# Patient Record
Sex: Female | Born: 1950 | Race: Black or African American | Hispanic: No | Marital: Married | Smoking: Former smoker
Health system: Southern US, Community
[De-identification: ages and names within clinical notes are randomized; demographics above are authoritative.]

## PROBLEM LIST (undated history)

## (undated) HISTORY — PX: ABDOMINAL HYSTERECTOMY: SHX81

---

## 1999-09-24 ENCOUNTER — Encounter: Payer: Self-pay | Admitting: Family Medicine

## 1999-09-24 ENCOUNTER — Ambulatory Visit (HOSPITAL_COMMUNITY): Admission: RE | Admit: 1999-09-24 | Discharge: 1999-09-24 | Payer: Self-pay | Admitting: Family Medicine

## 2000-05-12 ENCOUNTER — Encounter: Payer: Self-pay | Admitting: Family Medicine

## 2000-05-12 ENCOUNTER — Encounter: Admission: RE | Admit: 2000-05-12 | Discharge: 2000-05-12 | Payer: Self-pay | Admitting: Family Medicine

## 2000-06-20 ENCOUNTER — Encounter: Admission: RE | Admit: 2000-06-20 | Discharge: 2000-06-20 | Payer: Self-pay | Admitting: Obstetrics and Gynecology

## 2000-06-20 ENCOUNTER — Encounter: Payer: Self-pay | Admitting: Obstetrics and Gynecology

## 2000-07-14 ENCOUNTER — Encounter: Payer: Self-pay | Admitting: Obstetrics and Gynecology

## 2000-07-14 ENCOUNTER — Encounter: Admission: RE | Admit: 2000-07-14 | Discharge: 2000-07-14 | Payer: Self-pay | Admitting: Obstetrics and Gynecology

## 2001-09-14 ENCOUNTER — Other Ambulatory Visit: Admission: RE | Admit: 2001-09-14 | Discharge: 2001-09-14 | Payer: Self-pay | Admitting: Obstetrics and Gynecology

## 2001-12-25 ENCOUNTER — Ambulatory Visit (HOSPITAL_COMMUNITY): Admission: RE | Admit: 2001-12-25 | Discharge: 2001-12-25 | Payer: Self-pay | Admitting: Family Medicine

## 2001-12-25 ENCOUNTER — Encounter: Payer: Self-pay | Admitting: Family Medicine

## 2005-07-30 ENCOUNTER — Encounter: Admission: RE | Admit: 2005-07-30 | Discharge: 2005-07-30 | Payer: Self-pay | Admitting: Family Medicine

## 2005-08-09 ENCOUNTER — Ambulatory Visit (HOSPITAL_COMMUNITY): Admission: RE | Admit: 2005-08-09 | Discharge: 2005-08-09 | Payer: Self-pay | Admitting: Family Medicine

## 2006-09-22 ENCOUNTER — Encounter: Admission: RE | Admit: 2006-09-22 | Discharge: 2006-09-22 | Payer: Self-pay | Admitting: Family Medicine

## 2008-04-10 ENCOUNTER — Encounter: Admission: RE | Admit: 2008-04-10 | Discharge: 2008-04-10 | Payer: Self-pay | Admitting: Family Medicine

## 2009-06-11 ENCOUNTER — Encounter: Admission: RE | Admit: 2009-06-11 | Discharge: 2009-06-11 | Payer: Self-pay | Admitting: Obstetrics and Gynecology

## 2014-02-26 ENCOUNTER — Other Ambulatory Visit: Payer: Self-pay

## 2014-02-26 DIAGNOSIS — Z1231 Encounter for screening mammogram for malignant neoplasm of breast: Secondary | ICD-10-CM

## 2014-03-12 ENCOUNTER — Ambulatory Visit
Admission: RE | Admit: 2014-03-12 | Discharge: 2014-03-12 | Disposition: A | Discharge status: Home / Self Care | Payer: BC Managed Care – PPO | Source: Ambulatory Visit

## 2014-03-12 DIAGNOSIS — Z1231 Encounter for screening mammogram for malignant neoplasm of breast: Secondary | ICD-10-CM

## 2014-09-18 DIAGNOSIS — I10 Essential (primary) hypertension: Secondary | ICD-10-CM | POA: Diagnosis not present

## 2015-01-08 DIAGNOSIS — H2513 Age-related nuclear cataract, bilateral: Secondary | ICD-10-CM | POA: Diagnosis not present

## 2015-01-08 DIAGNOSIS — H43812 Vitreous degeneration, left eye: Secondary | ICD-10-CM | POA: Diagnosis not present

## 2015-01-08 DIAGNOSIS — H43392 Other vitreous opacities, left eye: Secondary | ICD-10-CM | POA: Diagnosis not present

## 2015-01-08 DIAGNOSIS — H16103 Unspecified superficial keratitis, bilateral: Secondary | ICD-10-CM | POA: Diagnosis not present

## 2015-01-27 DIAGNOSIS — H16102 Unspecified superficial keratitis, left eye: Secondary | ICD-10-CM | POA: Diagnosis not present

## 2015-01-27 DIAGNOSIS — H43812 Vitreous degeneration, left eye: Secondary | ICD-10-CM | POA: Diagnosis not present

## 2015-01-27 DIAGNOSIS — H16101 Unspecified superficial keratitis, right eye: Secondary | ICD-10-CM | POA: Diagnosis not present

## 2015-01-27 DIAGNOSIS — H43392 Other vitreous opacities, left eye: Secondary | ICD-10-CM | POA: Diagnosis not present

## 2015-01-28 ENCOUNTER — Other Ambulatory Visit: Payer: Self-pay

## 2015-01-28 DIAGNOSIS — Z1231 Encounter for screening mammogram for malignant neoplasm of breast: Secondary | ICD-10-CM

## 2015-03-18 ENCOUNTER — Ambulatory Visit
Admission: RE | Admit: 2015-03-18 | Discharge: 2015-03-18 | Disposition: A | Discharge status: Home / Self Care | Payer: Commercial Managed Care - HMO | Source: Ambulatory Visit

## 2015-03-18 DIAGNOSIS — Z1231 Encounter for screening mammogram for malignant neoplasm of breast: Secondary | ICD-10-CM | POA: Diagnosis not present

## 2015-04-08 DIAGNOSIS — Z1389 Encounter for screening for other disorder: Secondary | ICD-10-CM | POA: Diagnosis not present

## 2015-04-08 DIAGNOSIS — Z0000 Encounter for general adult medical examination without abnormal findings: Secondary | ICD-10-CM | POA: Diagnosis not present

## 2015-04-08 DIAGNOSIS — I10 Essential (primary) hypertension: Secondary | ICD-10-CM | POA: Diagnosis not present

## 2015-10-08 DIAGNOSIS — I10 Essential (primary) hypertension: Secondary | ICD-10-CM | POA: Diagnosis not present

## 2015-10-08 DIAGNOSIS — M722 Plantar fascial fibromatosis: Secondary | ICD-10-CM | POA: Diagnosis not present

## 2016-03-16 ENCOUNTER — Other Ambulatory Visit: Payer: Self-pay

## 2016-03-16 DIAGNOSIS — Z1231 Encounter for screening mammogram for malignant neoplasm of breast: Secondary | ICD-10-CM

## 2016-03-31 ENCOUNTER — Ambulatory Visit
Admission: RE | Admit: 2016-03-31 | Discharge: 2016-03-31 | Disposition: A | Discharge status: Home / Self Care | Payer: Commercial Managed Care - HMO | Source: Ambulatory Visit

## 2016-03-31 DIAGNOSIS — Z1231 Encounter for screening mammogram for malignant neoplasm of breast: Secondary | ICD-10-CM | POA: Diagnosis not present

## 2016-04-19 DIAGNOSIS — Z0000 Encounter for general adult medical examination without abnormal findings: Secondary | ICD-10-CM | POA: Diagnosis not present

## 2016-04-19 DIAGNOSIS — M722 Plantar fascial fibromatosis: Secondary | ICD-10-CM | POA: Diagnosis not present

## 2016-04-19 DIAGNOSIS — I10 Essential (primary) hypertension: Secondary | ICD-10-CM | POA: Diagnosis not present

## 2016-04-19 DIAGNOSIS — N951 Menopausal and female climacteric states: Secondary | ICD-10-CM | POA: Diagnosis not present

## 2016-04-19 DIAGNOSIS — L259 Unspecified contact dermatitis, unspecified cause: Secondary | ICD-10-CM | POA: Diagnosis not present

## 2016-05-17 DIAGNOSIS — L820 Inflamed seborrheic keratosis: Secondary | ICD-10-CM | POA: Diagnosis not present

## 2016-05-17 DIAGNOSIS — L438 Other lichen planus: Secondary | ICD-10-CM | POA: Diagnosis not present

## 2016-05-17 DIAGNOSIS — L811 Chloasma: Secondary | ICD-10-CM | POA: Diagnosis not present

## 2016-07-19 DIAGNOSIS — I10 Essential (primary) hypertension: Secondary | ICD-10-CM | POA: Diagnosis not present

## 2016-09-10 LAB — POCT GLYCOSYLATED HEMOGLOBIN (HGB A1C)

## 2016-09-10 LAB — GLUCOSE, POCT (MANUAL RESULT ENTRY): POC GLUCOSE: 101 mg/dL — AB (ref 70–99)

## 2016-09-21 DIAGNOSIS — Z23 Encounter for immunization: Secondary | ICD-10-CM | POA: Diagnosis not present

## 2016-09-21 DIAGNOSIS — I10 Essential (primary) hypertension: Secondary | ICD-10-CM | POA: Diagnosis not present

## 2017-01-31 DIAGNOSIS — H5201 Hypermetropia, right eye: Secondary | ICD-10-CM | POA: Diagnosis not present

## 2017-04-08 ENCOUNTER — Other Ambulatory Visit: Payer: Self-pay | Admitting: Family Medicine

## 2017-04-08 DIAGNOSIS — Z1231 Encounter for screening mammogram for malignant neoplasm of breast: Secondary | ICD-10-CM

## 2017-04-25 DIAGNOSIS — Z6841 Body Mass Index (BMI) 40.0 and over, adult: Secondary | ICD-10-CM | POA: Diagnosis not present

## 2017-04-25 DIAGNOSIS — L729 Follicular cyst of the skin and subcutaneous tissue, unspecified: Secondary | ICD-10-CM | POA: Diagnosis not present

## 2017-04-25 DIAGNOSIS — R030 Elevated blood-pressure reading, without diagnosis of hypertension: Secondary | ICD-10-CM | POA: Diagnosis not present

## 2017-05-02 DIAGNOSIS — K635 Polyp of colon: Secondary | ICD-10-CM | POA: Diagnosis not present

## 2017-05-03 ENCOUNTER — Ambulatory Visit: Payer: Commercial Managed Care - HMO

## 2017-05-11 ENCOUNTER — Ambulatory Visit
Admission: RE | Admit: 2017-05-11 | Discharge: 2017-05-11 | Disposition: A | Discharge status: Home / Self Care | Payer: Commercial Managed Care - HMO | Source: Ambulatory Visit | Attending: Family Medicine | Admitting: Family Medicine

## 2017-05-11 DIAGNOSIS — Z1231 Encounter for screening mammogram for malignant neoplasm of breast: Secondary | ICD-10-CM

## 2017-11-28 DIAGNOSIS — Z0000 Encounter for general adult medical examination without abnormal findings: Secondary | ICD-10-CM | POA: Diagnosis not present

## 2017-11-28 DIAGNOSIS — M109 Gout, unspecified: Secondary | ICD-10-CM | POA: Diagnosis not present

## 2017-11-28 DIAGNOSIS — Z1389 Encounter for screening for other disorder: Secondary | ICD-10-CM | POA: Diagnosis not present

## 2017-11-28 DIAGNOSIS — Z6841 Body Mass Index (BMI) 40.0 and over, adult: Secondary | ICD-10-CM | POA: Diagnosis not present

## 2017-11-28 DIAGNOSIS — I10 Essential (primary) hypertension: Secondary | ICD-10-CM | POA: Diagnosis not present

## 2018-06-14 DIAGNOSIS — M109 Gout, unspecified: Secondary | ICD-10-CM | POA: Diagnosis not present

## 2018-06-14 DIAGNOSIS — Z6841 Body Mass Index (BMI) 40.0 and over, adult: Secondary | ICD-10-CM | POA: Diagnosis not present

## 2018-06-14 DIAGNOSIS — I10 Essential (primary) hypertension: Secondary | ICD-10-CM | POA: Diagnosis not present

## 2018-06-14 DIAGNOSIS — Z1159 Encounter for screening for other viral diseases: Secondary | ICD-10-CM | POA: Diagnosis not present

## 2018-06-15 ENCOUNTER — Other Ambulatory Visit: Payer: Self-pay | Admitting: Family Medicine

## 2018-06-15 ENCOUNTER — Ambulatory Visit: Payer: Self-pay | Admitting: Podiatry

## 2018-06-15 DIAGNOSIS — Z1231 Encounter for screening mammogram for malignant neoplasm of breast: Secondary | ICD-10-CM

## 2018-07-11 ENCOUNTER — Other Ambulatory Visit: Payer: Self-pay | Admitting: Podiatry

## 2018-07-11 ENCOUNTER — Encounter: Payer: Self-pay | Admitting: Podiatry

## 2018-07-11 ENCOUNTER — Ambulatory Visit (INDEPENDENT_AMBULATORY_CARE_PROVIDER_SITE_OTHER): Payer: Medicare HMO

## 2018-07-11 ENCOUNTER — Encounter

## 2018-07-11 ENCOUNTER — Ambulatory Visit: Payer: Medicare HMO | Admitting: Podiatry

## 2018-07-11 VITALS — BP 157/80 | HR 83 | Resp 16

## 2018-07-11 DIAGNOSIS — M7751 Other enthesopathy of right foot: Secondary | ICD-10-CM | POA: Diagnosis not present

## 2018-07-11 DIAGNOSIS — G5761 Lesion of plantar nerve, right lower limb: Secondary | ICD-10-CM

## 2018-07-11 DIAGNOSIS — G5781 Other specified mononeuropathies of right lower limb: Secondary | ICD-10-CM

## 2018-07-11 DIAGNOSIS — M779 Enthesopathy, unspecified: Principal | ICD-10-CM

## 2018-07-11 DIAGNOSIS — M778 Other enthesopathies, not elsewhere classified: Secondary | ICD-10-CM

## 2018-07-11 DIAGNOSIS — M722 Plantar fascial fibromatosis: Secondary | ICD-10-CM

## 2018-07-12 NOTE — Progress Notes (Signed)
  Subjective:  Patient ID: Carrie Woods, female    DOB: November 29, 1951,  MRN: [id number] HPI Chief Complaint  Patient presents with  . Foot Pain    Plantar forefoot and arch right - aching and numbness x 1 month, "pebbles in foot sometimes", history of PF, took anti-inflam and inserts (helped then), PCP rx'd meloxicam - no help and icing at home  . New Patient (Initial Visit)    67 y.o. female presents with the above complaint.   ROS: Denies fever chills nausea vomiting muscle aches pains calf pain back pain chest pain shortness of breath.  No past medical history on file. Past Surgical History:  Procedure Laterality Date  . ABDOMINAL HYSTERECTOMY      Current Outpatient Medications:  .  amLODipine (NORVASC) 5 MG tablet, Take 5 mg by mouth daily., Disp: , Rfl:  .  naproxen (NAPROSYN) 500 MG tablet, Take 500 mg by mouth 2 (two) times daily with a meal., Disp: , Rfl:  .  olmesartan (BENICAR) 40 MG tablet, Take 40 mg by mouth daily., Disp: , Rfl:  .  allopurinol (ZYLOPRIM) 300 MG tablet, 1/2 TABLET TWICE A DAY X 1 WEEK, THEN 1 TABLET ONCE A DAY THEREAFTER ORALLY, Disp: , Rfl: 5 .  meloxicam (MOBIC) 15 MG tablet, 1 TABLET EVERY MORNING WITH FOOD ORALLY, Disp: , Rfl: 5  No Known Allergies Review of Systems Objective:   Vitals:   07/11/18 1030  BP: (!) 157/80  Pulse: 83  Resp: 16    General: Well developed, nourished, in no acute distress, alert and oriented x3   Dermatological: Skin is warm, dry and supple bilateral. Nails x 10 are well maintained; remaining integument appears unremarkable at this time. There are no open sores, no preulcerative lesions, no rash or signs of infection present.  Vascular: Dorsalis Pedis artery and Posterior Tibial artery pedal pulses are 2/4 bilateral with immedate capillary fill time. Pedal hair growth present. No varicosities and no lower extremity edema present bilateral.   Neruologic: Grossly intact via light touch bilateral. Vibratory intact via  tuning fork bilateral. Protective threshold with Semmes Wienstein monofilament intact to all pedal sites bilateral. Patellar and Achilles deep tendon reflexes 2+ bilateral. No Babinski or clonus noted bilateral.   Musculoskeletal: No gross boney pedal deformities bilateral. No pain, crepitus, or limitation noted with foot and ankle range of motion bilateral. Muscular strength 5/5 in all groups tested bilateral.  Pain on palpation and end range of motion of the second metatarsal phalangeal joint.  She also has limited range of motion of the first metatarsophalangeal joints with mild bunion deformities bilateral.  Gait: Unassisted, Nonantalgic.    Radiographs:  Taken today demonstrate no major osseous abnormalities to the forefoot.  No acute findings.  Joint space narrowing of the first metatarsophalangeal joint of the right foot with mild bunion deformity is also noted.  Assessment & Plan:   Assessment: Capsulitis second metatarsal phalangeal joint right foot  Plan: Discussed etiology pathology conservative versus surgical therapies at this point after sterile Betadine skin prep I injected 20 mg Kenalog 5 mg Marcaine and a periarticular fashion around the second metatarsal phalangeal joint right foot.  We discussed appropriate shoe gear stretching exercises ice therapy as your modifications.  I will follow-up with her in a month or so.     Jahyra Sukup Carrie Woods, [place]

## 2018-07-14 ENCOUNTER — Ambulatory Visit
Admission: RE | Admit: 2018-07-14 | Discharge: 2018-07-14 | Disposition: A | Discharge status: Home / Self Care | Payer: Medicare HMO | Source: Ambulatory Visit | Attending: Family Medicine | Admitting: Family Medicine

## 2018-07-14 DIAGNOSIS — Z1231 Encounter for screening mammogram for malignant neoplasm of breast: Secondary | ICD-10-CM | POA: Diagnosis not present

## 2018-08-08 ENCOUNTER — Ambulatory Visit: Payer: Medicare HMO | Admitting: Podiatry

## 2018-09-04 DIAGNOSIS — H5201 Hypermetropia, right eye: Secondary | ICD-10-CM | POA: Diagnosis not present

## 2018-09-21 ENCOUNTER — Telehealth: Payer: Self-pay | Admitting: Podiatry

## 2018-09-21 MED ORDER — METHYLPREDNISOLONE 4 MG PO TBPK: ORAL_TABLET | ORAL | 0 refills | Status: DC

## 2018-09-21 NOTE — Telephone Encounter (Signed)
I called pt, informed the medrol dose pack had been called to the [id number].

## 2018-09-21 NOTE — Telephone Encounter (Signed)
I'm a pt of Dr. Al Corpus and I would like to speak to someone regarding him sending a prescription in for me. My number is [phone]. Thank you.

## 2018-09-21 NOTE — Telephone Encounter (Signed)
I called pt, she states she is having the same symptoms as June or July problem she saw Dr. Al Corpus for and had an appt 10/10/2018, would like a prednisone rx to ease off the inflammation until her appt.

## 2018-09-21 NOTE — Addendum Note (Signed)
Addended by: Alphia Kava D on: 09/21/2018 05:11 PM   Modules accepted: Orders

## 2018-09-21 NOTE — Telephone Encounter (Signed)
Ok medrol dosepack

## 2018-10-10 ENCOUNTER — Encounter: Payer: Self-pay | Admitting: Podiatry

## 2018-10-10 ENCOUNTER — Ambulatory Visit: Payer: Medicare HMO | Admitting: Podiatry

## 2018-10-10 DIAGNOSIS — M779 Enthesopathy, unspecified: Secondary | ICD-10-CM

## 2018-10-10 DIAGNOSIS — M778 Other enthesopathies, not elsewhere classified: Secondary | ICD-10-CM

## 2018-10-10 NOTE — Progress Notes (Signed)
She presents today for follow-up of capsulitis neuritis to the second metatarsal phalangeal joint of the right foot.  States that is gotten worse again in the last 3 weeks but have been doing pretty good until that point.  She is concerned that it may be something else.  Objective: Vital signs are stable she is alert and oriented x3.  Pulses are palpable.  She has pain on palpation of fibular sesamoid and at the second metatarsal phalangeal joint area.  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  Plan: This point I provided her with 10 mg injection first metatarsal space deep with 5 mg Marcaine.  Tolerated procedure well without complications.  If this does not resolve her symptoms MRI and blood work will be necessary.

## 2018-11-21 ENCOUNTER — Ambulatory Visit: Payer: Medicare HMO | Admitting: Podiatry

## 2018-11-28 ENCOUNTER — Ambulatory Visit: Payer: Medicare HMO | Admitting: Podiatry

## 2018-12-05 ENCOUNTER — Ambulatory Visit: Payer: Medicare HMO | Admitting: Podiatry

## 2018-12-05 DIAGNOSIS — Z0000 Encounter for general adult medical examination without abnormal findings: Secondary | ICD-10-CM | POA: Diagnosis not present

## 2018-12-05 DIAGNOSIS — E2839 Other primary ovarian failure: Secondary | ICD-10-CM | POA: Diagnosis not present

## 2018-12-05 DIAGNOSIS — M109 Gout, unspecified: Secondary | ICD-10-CM | POA: Diagnosis not present

## 2018-12-05 DIAGNOSIS — Z6841 Body Mass Index (BMI) 40.0 and over, adult: Secondary | ICD-10-CM | POA: Diagnosis not present

## 2018-12-05 DIAGNOSIS — Z1159 Encounter for screening for other viral diseases: Secondary | ICD-10-CM | POA: Diagnosis not present

## 2018-12-05 DIAGNOSIS — I10 Essential (primary) hypertension: Secondary | ICD-10-CM | POA: Diagnosis not present

## 2018-12-05 DIAGNOSIS — Z1389 Encounter for screening for other disorder: Secondary | ICD-10-CM | POA: Diagnosis not present

## 2018-12-12 ENCOUNTER — Ambulatory Visit (INDEPENDENT_AMBULATORY_CARE_PROVIDER_SITE_OTHER): Payer: Medicare HMO | Admitting: Podiatry

## 2018-12-12 ENCOUNTER — Encounter: Payer: Self-pay | Admitting: Podiatry

## 2018-12-12 DIAGNOSIS — M779 Enthesopathy, unspecified: Secondary | ICD-10-CM

## 2018-12-12 DIAGNOSIS — M778 Other enthesopathies, not elsewhere classified: Secondary | ICD-10-CM

## 2018-12-12 NOTE — Progress Notes (Signed)
She presents today for follow-up of capsulitis first metatarsophalangeal joint right foot states that is still stiff and tender states that the first injection helped with the second injection did not do anything.  Objective: Vital signs are stable alert and oriented x3 pulses are palpable.  Neurologic sensorium is intact.  Deep tendon reflexes are intact muscle strength is normal symmetrical.  She has limited range of motion of the first metatarsophalangeal joint and pain on palpation of the second metatarsal phalangeal joint of the right foot.  Assessment: Capsulitis first metatarsophalangeal joint of the right foot.  Hallux abductovalgus deformity with hallux rigidus.  Capsulitis of the second metatarsal phalangeal joint.  Plan: At this point secondary to surgical consideration of chronic pain I am requesting MRI evaluation of the forefoot right.  I will follow-up with her once the MRI report has arrived.

## 2018-12-13 ENCOUNTER — Telehealth: Payer: Self-pay | Admitting: *Deleted

## 2018-12-13 DIAGNOSIS — M19079 Primary osteoarthritis, unspecified ankle and foot: Secondary | ICD-10-CM

## 2018-12-13 NOTE — Telephone Encounter (Signed)
-----   Message from Kristian CoveyAshley E Prevette, [facility] sent at 12/12/2018  2:15 PM EST ----- Regarding: MRI MRI right foot - evaluate arthritis/capsulitis 1st and 2nd MPJ right - surgical consideration

## 2018-12-13 NOTE — Telephone Encounter (Signed)
Faxed orders to [facility]. 

## 2019-01-22 ENCOUNTER — Other Ambulatory Visit: Payer: Medicare HMO

## 2019-04-10 ENCOUNTER — Other Ambulatory Visit: Payer: Self-pay | Admitting: Family Medicine

## 2019-04-10 DIAGNOSIS — Z1231 Encounter for screening mammogram for malignant neoplasm of breast: Secondary | ICD-10-CM

## 2019-06-06 ENCOUNTER — Other Ambulatory Visit: Payer: Self-pay | Admitting: *Deleted

## 2019-06-06 DIAGNOSIS — Z20822 Contact with and (suspected) exposure to covid-19: Secondary | ICD-10-CM

## 2019-06-06 NOTE — Progress Notes (Signed)
[id number]

## 2019-06-07 LAB — NOVEL CORONAVIRUS, NAA: SARS-CoV-2, NAA: NOT DETECTED

## 2019-06-19 ENCOUNTER — Other Ambulatory Visit: Payer: Self-pay | Admitting: Family Medicine

## 2019-06-19 DIAGNOSIS — L980 Pyogenic granuloma: Secondary | ICD-10-CM | POA: Diagnosis not present

## 2019-06-19 DIAGNOSIS — I10 Essential (primary) hypertension: Secondary | ICD-10-CM | POA: Diagnosis not present

## 2019-06-19 DIAGNOSIS — E2839 Other primary ovarian failure: Secondary | ICD-10-CM | POA: Diagnosis not present

## 2019-06-19 DIAGNOSIS — M109 Gout, unspecified: Secondary | ICD-10-CM | POA: Diagnosis not present

## 2019-07-16 DIAGNOSIS — M79645 Pain in left finger(s): Secondary | ICD-10-CM | POA: Diagnosis not present

## 2019-07-16 DIAGNOSIS — R2231 Localized swelling, mass and lump, right upper limb: Secondary | ICD-10-CM | POA: Diagnosis not present

## 2019-07-24 ENCOUNTER — Other Ambulatory Visit: Payer: Self-pay

## 2019-07-24 ENCOUNTER — Ambulatory Visit
Admission: RE | Admit: 2019-07-24 | Discharge: 2019-07-24 | Disposition: A | Discharge status: Home / Self Care | Payer: Medicare HMO | Source: Ambulatory Visit | Attending: Family Medicine | Admitting: Family Medicine

## 2019-07-24 DIAGNOSIS — Z1231 Encounter for screening mammogram for malignant neoplasm of breast: Secondary | ICD-10-CM

## 2019-08-13 DIAGNOSIS — R2231 Localized swelling, mass and lump, right upper limb: Secondary | ICD-10-CM | POA: Diagnosis not present

## 2019-08-30 ENCOUNTER — Other Ambulatory Visit: Payer: Self-pay

## 2019-08-30 ENCOUNTER — Ambulatory Visit
Admission: RE | Admit: 2019-08-30 | Discharge: 2019-08-30 | Disposition: A | Discharge status: Home / Self Care | Payer: Medicare HMO | Source: Ambulatory Visit | Attending: Family Medicine | Admitting: Family Medicine

## 2019-08-30 DIAGNOSIS — Z13820 Encounter for screening for osteoporosis: Secondary | ICD-10-CM | POA: Diagnosis not present

## 2019-08-30 DIAGNOSIS — E2839 Other primary ovarian failure: Secondary | ICD-10-CM

## 2019-08-30 DIAGNOSIS — Z780 Asymptomatic menopausal state: Secondary | ICD-10-CM | POA: Diagnosis not present

## 2019-11-05 ENCOUNTER — Other Ambulatory Visit: Payer: Self-pay

## 2019-11-05 DIAGNOSIS — Z20822 Contact with and (suspected) exposure to covid-19: Secondary | ICD-10-CM

## 2019-11-06 LAB — NOVEL CORONAVIRUS, NAA: SARS-CoV-2, NAA: NOT DETECTED

## 2019-12-27 ENCOUNTER — Other Ambulatory Visit: Payer: Self-pay

## 2019-12-27 DIAGNOSIS — Z20822 Contact with and (suspected) exposure to covid-19: Secondary | ICD-10-CM

## 2019-12-29 LAB — NOVEL CORONAVIRUS, NAA

## 2020-01-02 ENCOUNTER — Other Ambulatory Visit: Payer: Self-pay

## 2020-01-02 DIAGNOSIS — Z20822 Contact with and (suspected) exposure to covid-19: Secondary | ICD-10-CM | POA: Diagnosis not present

## 2020-01-03 LAB — NOVEL CORONAVIRUS, NAA: SARS-CoV-2, NAA: NOT DETECTED

## 2020-01-07 DIAGNOSIS — Z1389 Encounter for screening for other disorder: Secondary | ICD-10-CM | POA: Diagnosis not present

## 2020-01-07 DIAGNOSIS — M109 Gout, unspecified: Secondary | ICD-10-CM | POA: Diagnosis not present

## 2020-01-07 DIAGNOSIS — I10 Essential (primary) hypertension: Secondary | ICD-10-CM | POA: Diagnosis not present

## 2020-01-07 DIAGNOSIS — Z6841 Body Mass Index (BMI) 40.0 and over, adult: Secondary | ICD-10-CM | POA: Diagnosis not present

## 2020-01-07 DIAGNOSIS — R609 Edema, unspecified: Secondary | ICD-10-CM | POA: Diagnosis not present

## 2020-01-07 DIAGNOSIS — Z0000 Encounter for general adult medical examination without abnormal findings: Secondary | ICD-10-CM | POA: Diagnosis not present

## 2020-01-07 DIAGNOSIS — E2839 Other primary ovarian failure: Secondary | ICD-10-CM | POA: Diagnosis not present

## 2020-01-07 DIAGNOSIS — M545 Low back pain: Secondary | ICD-10-CM | POA: Diagnosis not present

## 2020-01-30 DIAGNOSIS — M109 Gout, unspecified: Secondary | ICD-10-CM | POA: Diagnosis not present

## 2020-01-30 DIAGNOSIS — I10 Essential (primary) hypertension: Secondary | ICD-10-CM | POA: Diagnosis not present

## 2020-02-14 DIAGNOSIS — Z1211 Encounter for screening for malignant neoplasm of colon: Secondary | ICD-10-CM | POA: Diagnosis not present

## 2020-02-21 DIAGNOSIS — Z0100 Encounter for examination of eyes and vision without abnormal findings: Secondary | ICD-10-CM | POA: Diagnosis not present

## 2020-02-23 ENCOUNTER — Other Ambulatory Visit: Payer: Self-pay

## 2020-02-23 DIAGNOSIS — Z20822 Contact with and (suspected) exposure to covid-19: Secondary | ICD-10-CM

## 2020-02-24 LAB — NOVEL CORONAVIRUS, NAA: SARS-CoV-2, NAA: NOT DETECTED

## 2020-03-14 DIAGNOSIS — Z1159 Encounter for screening for other viral diseases: Secondary | ICD-10-CM | POA: Diagnosis not present

## 2020-03-19 DIAGNOSIS — K621 Rectal polyp: Secondary | ICD-10-CM | POA: Diagnosis not present

## 2020-03-19 DIAGNOSIS — K648 Other hemorrhoids: Secondary | ICD-10-CM | POA: Diagnosis not present

## 2020-03-19 DIAGNOSIS — K635 Polyp of colon: Secondary | ICD-10-CM | POA: Diagnosis not present

## 2020-03-19 DIAGNOSIS — K5730 Diverticulosis of large intestine without perforation or abscess without bleeding: Secondary | ICD-10-CM | POA: Diagnosis not present

## 2020-03-19 DIAGNOSIS — Z1211 Encounter for screening for malignant neoplasm of colon: Secondary | ICD-10-CM | POA: Diagnosis not present

## 2020-03-21 DIAGNOSIS — K621 Rectal polyp: Secondary | ICD-10-CM | POA: Diagnosis not present

## 2020-03-21 DIAGNOSIS — K635 Polyp of colon: Secondary | ICD-10-CM | POA: Diagnosis not present

## 2020-05-30 ENCOUNTER — Other Ambulatory Visit: Payer: Self-pay | Admitting: Family Medicine

## 2020-05-30 DIAGNOSIS — Z1231 Encounter for screening mammogram for malignant neoplasm of breast: Secondary | ICD-10-CM

## 2020-07-08 DIAGNOSIS — M545 Low back pain: Secondary | ICD-10-CM | POA: Diagnosis not present

## 2020-07-08 DIAGNOSIS — M109 Gout, unspecified: Secondary | ICD-10-CM | POA: Diagnosis not present

## 2020-07-08 DIAGNOSIS — E2839 Other primary ovarian failure: Secondary | ICD-10-CM | POA: Diagnosis not present

## 2020-07-08 DIAGNOSIS — I10 Essential (primary) hypertension: Secondary | ICD-10-CM | POA: Diagnosis not present

## 2020-07-08 DIAGNOSIS — Z6841 Body Mass Index (BMI) 40.0 and over, adult: Secondary | ICD-10-CM | POA: Diagnosis not present

## 2020-07-08 DIAGNOSIS — R609 Edema, unspecified: Secondary | ICD-10-CM | POA: Diagnosis not present

## 2020-07-29 ENCOUNTER — Ambulatory Visit: Payer: Medicare HMO

## 2020-07-30 DIAGNOSIS — H524 Presbyopia: Secondary | ICD-10-CM | POA: Diagnosis not present

## 2020-08-05 ENCOUNTER — Other Ambulatory Visit: Payer: Self-pay

## 2020-08-05 ENCOUNTER — Ambulatory Visit
Admission: RE | Admit: 2020-08-05 | Discharge: 2020-08-05 | Disposition: A | Discharge status: Home / Self Care | Payer: Medicare HMO | Source: Ambulatory Visit | Attending: Family Medicine | Admitting: Family Medicine

## 2020-08-05 DIAGNOSIS — Z1231 Encounter for screening mammogram for malignant neoplasm of breast: Secondary | ICD-10-CM | POA: Diagnosis not present

## 2020-08-13 ENCOUNTER — Other Ambulatory Visit: Payer: Self-pay | Admitting: Critical Care Medicine

## 2020-08-13 ENCOUNTER — Other Ambulatory Visit: Payer: Medicare HMO

## 2020-08-13 DIAGNOSIS — Z20822 Contact with and (suspected) exposure to covid-19: Secondary | ICD-10-CM

## 2020-08-15 LAB — SARS-COV-2, NAA 2 DAY TAT

## 2020-08-15 LAB — NOVEL CORONAVIRUS, NAA: SARS-CoV-2, NAA: NOT DETECTED

## 2020-08-20 ENCOUNTER — Other Ambulatory Visit: Payer: Medicare HMO

## 2020-09-25 DIAGNOSIS — Z20828 Contact with and (suspected) exposure to other viral communicable diseases: Secondary | ICD-10-CM | POA: Diagnosis not present

## 2020-10-11 ENCOUNTER — Ambulatory Visit: Payer: Medicare HMO | Attending: Internal Medicine

## 2020-10-11 ENCOUNTER — Other Ambulatory Visit: Payer: Self-pay

## 2020-10-11 DIAGNOSIS — Z23 Encounter for immunization: Secondary | ICD-10-CM

## 2020-10-11 NOTE — Progress Notes (Signed)
   Covid-19 Vaccination Clinic  Name:  ROJEAN IGE    MRN: [id number] DOB: Jan 16, 1951  10/11/2020  Ms. Ferre was observed post Covid-19 immunization for 15 minutes without incident. She was provided with Vaccine Information Sheet and instruction to access the V-Safe system.   Ms. Jorstad was instructed to call 911 with any severe reactions post vaccine: Marland Kitchen Difficulty breathing  . Swelling of face and throat  . A fast heartbeat  . A bad rash all over body  . Dizziness and weakness

## 2020-11-05 ENCOUNTER — Ambulatory Visit: Payer: Medicare HMO

## 2021-01-02 DIAGNOSIS — Z20822 Contact with and (suspected) exposure to covid-19: Secondary | ICD-10-CM | POA: Diagnosis not present

## 2021-01-13 DIAGNOSIS — Z0000 Encounter for general adult medical examination without abnormal findings: Secondary | ICD-10-CM | POA: Diagnosis not present

## 2021-01-13 DIAGNOSIS — R609 Edema, unspecified: Secondary | ICD-10-CM | POA: Diagnosis not present

## 2021-01-13 DIAGNOSIS — Z6841 Body Mass Index (BMI) 40.0 and over, adult: Secondary | ICD-10-CM | POA: Diagnosis not present

## 2021-01-13 DIAGNOSIS — E2839 Other primary ovarian failure: Secondary | ICD-10-CM | POA: Diagnosis not present

## 2021-01-13 DIAGNOSIS — I10 Essential (primary) hypertension: Secondary | ICD-10-CM | POA: Diagnosis not present

## 2021-01-13 DIAGNOSIS — M545 Low back pain, unspecified: Secondary | ICD-10-CM | POA: Diagnosis not present

## 2021-01-13 DIAGNOSIS — M109 Gout, unspecified: Secondary | ICD-10-CM | POA: Diagnosis not present

## 2021-05-26 DIAGNOSIS — H43813 Vitreous degeneration, bilateral: Secondary | ICD-10-CM | POA: Diagnosis not present

## 2021-06-17 ENCOUNTER — Ambulatory Visit: Payer: Medicare HMO | Attending: Internal Medicine

## 2021-06-17 DIAGNOSIS — Z23 Encounter for immunization: Secondary | ICD-10-CM

## 2021-06-17 NOTE — Progress Notes (Signed)
   Covid-19 Vaccination Clinic  Name:  Carrie Woods    MRN: [id number] DOB: 1951-05-04  06/17/2021  Carrie Woods was observed post Covid-19 immunization for 15 minutes without incident. She was provided with Vaccine Information Sheet and instruction to access the V-Safe system.   Carrie Woods was instructed to call 911 with any severe reactions post vaccine: Difficulty breathing  Swelling of face and throat  A fast heartbeat  A bad rash all over body  Dizziness and weakness   Immunizations Administered     Name Date Dose VIS Date Route   PFIZER Comrnaty(Gray TOP) Covid-19 Vaccine 06/17/2021 10:35 AM 0.3 mL 12/04/2020 Intramuscular   Manufacturer: [employer], [employer]   Lot: [id number]   NDC: [address]-4      Drusilla Kanner, PharmD, MBA Clinical Acute Care Pharmacist

## 2021-06-18 ENCOUNTER — Other Ambulatory Visit (HOSPITAL_COMMUNITY): Payer: Self-pay

## 2021-06-18 MED ORDER — COVID-19 MRNA VAC-TRIS(PFIZER) 30 MCG/0.3ML IM SUSP
INTRAMUSCULAR | 0 refills | Status: AC
  Filled 2021-06-18: qty 0.3, 17d supply, fill #0

## 2021-06-19 ENCOUNTER — Other Ambulatory Visit (HOSPITAL_COMMUNITY): Payer: Self-pay

## 2021-07-07 ENCOUNTER — Other Ambulatory Visit: Payer: Self-pay | Admitting: Family Medicine

## 2021-07-07 DIAGNOSIS — Z1231 Encounter for screening mammogram for malignant neoplasm of breast: Secondary | ICD-10-CM

## 2021-07-13 DIAGNOSIS — R609 Edema, unspecified: Secondary | ICD-10-CM | POA: Diagnosis not present

## 2021-07-13 DIAGNOSIS — M545 Low back pain, unspecified: Secondary | ICD-10-CM | POA: Diagnosis not present

## 2021-07-13 DIAGNOSIS — Z6841 Body Mass Index (BMI) 40.0 and over, adult: Secondary | ICD-10-CM | POA: Diagnosis not present

## 2021-07-13 DIAGNOSIS — E2839 Other primary ovarian failure: Secondary | ICD-10-CM | POA: Diagnosis not present

## 2021-07-13 DIAGNOSIS — I10 Essential (primary) hypertension: Secondary | ICD-10-CM | POA: Diagnosis not present

## 2021-07-13 DIAGNOSIS — M109 Gout, unspecified: Secondary | ICD-10-CM | POA: Diagnosis not present

## 2021-07-14 ENCOUNTER — Other Ambulatory Visit: Payer: Self-pay | Admitting: Family Medicine

## 2021-07-14 DIAGNOSIS — E2839 Other primary ovarian failure: Secondary | ICD-10-CM

## 2021-08-13 ENCOUNTER — Other Ambulatory Visit: Payer: Medicare HMO

## 2021-09-07 ENCOUNTER — Other Ambulatory Visit: Payer: Self-pay

## 2021-09-07 ENCOUNTER — Ambulatory Visit
Admission: RE | Admit: 2021-09-07 | Discharge: 2021-09-07 | Disposition: A | Discharge status: Home / Self Care | Payer: Medicare HMO | Source: Ambulatory Visit | Attending: Family Medicine | Admitting: Family Medicine

## 2021-09-07 DIAGNOSIS — Z1231 Encounter for screening mammogram for malignant neoplasm of breast: Secondary | ICD-10-CM

## 2021-09-09 ENCOUNTER — Other Ambulatory Visit: Payer: Self-pay

## 2021-09-09 ENCOUNTER — Ambulatory Visit
Admission: RE | Admit: 2021-09-09 | Discharge: 2021-09-09 | Disposition: A | Discharge status: Home / Self Care | Payer: Medicare HMO | Source: Ambulatory Visit | Attending: Family Medicine | Admitting: Family Medicine

## 2021-09-09 DIAGNOSIS — Z780 Asymptomatic menopausal state: Secondary | ICD-10-CM | POA: Diagnosis not present

## 2021-09-09 DIAGNOSIS — E2839 Other primary ovarian failure: Secondary | ICD-10-CM

## 2022-01-07 ENCOUNTER — Other Ambulatory Visit: Payer: Medicare HMO

## 2022-01-19 DIAGNOSIS — M25552 Pain in left hip: Secondary | ICD-10-CM | POA: Diagnosis not present

## 2022-01-19 DIAGNOSIS — M109 Gout, unspecified: Secondary | ICD-10-CM | POA: Diagnosis not present

## 2022-01-19 DIAGNOSIS — I10 Essential (primary) hypertension: Secondary | ICD-10-CM | POA: Diagnosis not present

## 2022-01-19 DIAGNOSIS — Z0000 Encounter for general adult medical examination without abnormal findings: Secondary | ICD-10-CM | POA: Diagnosis not present

## 2022-01-19 DIAGNOSIS — R609 Edema, unspecified: Secondary | ICD-10-CM | POA: Diagnosis not present

## 2022-01-19 DIAGNOSIS — E2839 Other primary ovarian failure: Secondary | ICD-10-CM | POA: Diagnosis not present

## 2022-01-19 DIAGNOSIS — Z1389 Encounter for screening for other disorder: Secondary | ICD-10-CM | POA: Diagnosis not present

## 2022-01-19 DIAGNOSIS — M545 Low back pain, unspecified: Secondary | ICD-10-CM | POA: Diagnosis not present

## 2022-04-12 ENCOUNTER — Other Ambulatory Visit: Payer: Self-pay | Admitting: Physician Assistant

## 2022-04-12 ENCOUNTER — Ambulatory Visit
Admission: RE | Admit: 2022-04-12 | Discharge: 2022-04-12 | Disposition: A | Discharge status: Home / Self Care | Payer: Medicare HMO | Source: Ambulatory Visit | Attending: Physician Assistant | Admitting: Physician Assistant

## 2022-04-12 ENCOUNTER — Ambulatory Visit: Payer: Medicare HMO

## 2022-04-12 ENCOUNTER — Other Ambulatory Visit: Payer: Self-pay | Admitting: Family Medicine

## 2022-04-12 DIAGNOSIS — M545 Low back pain, unspecified: Secondary | ICD-10-CM

## 2022-04-12 DIAGNOSIS — M79652 Pain in left thigh: Secondary | ICD-10-CM

## 2022-04-12 DIAGNOSIS — M25551 Pain in right hip: Secondary | ICD-10-CM

## 2022-04-12 DIAGNOSIS — M5136 Other intervertebral disc degeneration, lumbar region: Secondary | ICD-10-CM | POA: Diagnosis not present

## 2022-05-19 DIAGNOSIS — Z6841 Body Mass Index (BMI) 40.0 and over, adult: Secondary | ICD-10-CM | POA: Diagnosis not present

## 2022-05-19 DIAGNOSIS — M545 Low back pain, unspecified: Secondary | ICD-10-CM | POA: Diagnosis not present

## 2022-05-19 DIAGNOSIS — M25552 Pain in left hip: Secondary | ICD-10-CM | POA: Diagnosis not present

## 2022-05-19 DIAGNOSIS — M109 Gout, unspecified: Secondary | ICD-10-CM | POA: Diagnosis not present

## 2022-05-19 DIAGNOSIS — I10 Essential (primary) hypertension: Secondary | ICD-10-CM | POA: Diagnosis not present

## 2022-05-19 DIAGNOSIS — R609 Edema, unspecified: Secondary | ICD-10-CM | POA: Diagnosis not present

## 2022-05-19 DIAGNOSIS — E2839 Other primary ovarian failure: Secondary | ICD-10-CM | POA: Diagnosis not present

## 2022-05-19 DIAGNOSIS — N289 Disorder of kidney and ureter, unspecified: Secondary | ICD-10-CM | POA: Diagnosis not present

## 2022-07-19 ENCOUNTER — Other Ambulatory Visit: Payer: Self-pay | Admitting: Family Medicine

## 2022-07-19 DIAGNOSIS — Z1231 Encounter for screening mammogram for malignant neoplasm of breast: Secondary | ICD-10-CM

## 2022-07-20 DIAGNOSIS — H43813 Vitreous degeneration, bilateral: Secondary | ICD-10-CM | POA: Diagnosis not present

## 2022-07-21 DIAGNOSIS — Z0100 Encounter for examination of eyes and vision without abnormal findings: Secondary | ICD-10-CM | POA: Diagnosis not present

## 2022-09-08 ENCOUNTER — Ambulatory Visit
Admission: RE | Admit: 2022-09-08 | Discharge: 2022-09-08 | Disposition: A | Discharge status: Home / Self Care | Payer: Medicare HMO | Source: Ambulatory Visit | Attending: Family Medicine | Admitting: Family Medicine

## 2022-09-08 DIAGNOSIS — Z1231 Encounter for screening mammogram for malignant neoplasm of breast: Secondary | ICD-10-CM | POA: Diagnosis not present

## 2022-12-09 ENCOUNTER — Encounter: Payer: Self-pay | Admitting: Podiatry

## 2022-12-09 ENCOUNTER — Ambulatory Visit (INDEPENDENT_AMBULATORY_CARE_PROVIDER_SITE_OTHER): Payer: Medicare HMO

## 2022-12-09 ENCOUNTER — Ambulatory Visit: Payer: Medicare HMO | Admitting: Podiatry

## 2022-12-09 ENCOUNTER — Other Ambulatory Visit: Payer: Self-pay | Admitting: Podiatry

## 2022-12-09 DIAGNOSIS — M7751 Other enthesopathy of right foot: Secondary | ICD-10-CM | POA: Diagnosis not present

## 2022-12-09 DIAGNOSIS — M778 Other enthesopathies, not elsewhere classified: Secondary | ICD-10-CM

## 2022-12-09 MED ORDER — METHYLPREDNISOLONE 4 MG PO TBPK: ORAL_TABLET | ORAL | 0 refills | Status: AC

## 2022-12-09 MED ORDER — TRIAMCINOLONE ACETONIDE 40 MG/ML IJ SUSP
20.0000 mg | Freq: Once | INTRAMUSCULAR | Status: AC
  Administered 2022-12-09: 20 mg

## 2022-12-12 NOTE — Progress Notes (Signed)
  Subjective:  Patient ID: Carrie Woods, female    DOB: 1951/10/24,  MRN: [id number] HPI Chief Complaint  Patient presents with   Foot Pain    Flare of capsulitis/arthritis right - continued pain around the toes, numbness/swelling, now also pain around anterior ankle and swelling as well, lots of walking aggravates   New Patient (Initial Visit)    Est pt 2019    71 y.o. female presents with the above complaint.   ROS: Denies fever chills nausea vomit muscle aches pains calf pain back pain chest pain shortness of breath.  No past medical history on file. Past Surgical History:  Procedure Laterality Date   ABDOMINAL HYSTERECTOMY      Current Outpatient Medications:    Acetaminophen (TYLENOL ARTHRITIS PAIN PO), Take by mouth., Disp: , Rfl:    allopurinol (ZYLOPRIM) 300 MG tablet, 1/2 TABLET TWICE A DAY X 1 WEEK, THEN 1 TABLET ONCE A DAY THEREAFTER ORALLY, Disp: , Rfl: 5   amLODipine (NORVASC) 5 MG tablet, Take 5 mg by mouth daily., Disp: , Rfl:    celecoxib (CELEBREX) 100 MG capsule, Take 100 mg by mouth 2 (two) times daily., Disp: , Rfl:    furosemide (LASIX) 20 MG tablet, Take 20 mg by mouth daily., Disp: , Rfl:    irbesartan (AVAPRO) 300 MG tablet, , Disp: , Rfl:    methylPREDNISolone (MEDROL DOSEPAK) 4 MG TBPK tablet, 6 day dose pack - take as directed, Disp: 21 tablet, Rfl: 0   METOPROLOL SUCCINATE PO, Take by mouth., Disp: , Rfl:    COVID-19 mRNA Vac-TriS, Pfizer, SUSP injection, Inject into the muscle., Disp: 0.3 mL, Rfl: 0   FLUAD 0.5 ML SUSY, PHARMACIST ADMINISTERED IMMUNIZATION ADMINISTERED AT TIME OF DISPENSING, Disp: , Rfl: 0  No Known Allergies Review of Systems Objective:  There were no vitals filed for this visit.  General: Well developed, nourished, in no acute distress, alert and oriented x3   Dermatological: Skin is warm, dry and supple bilateral. Nails x 10 are well maintained; remaining integument appears unremarkable at this time. There are no open sores, no  preulcerative lesions, no rash or signs of infection present.  Vascular: Dorsalis Pedis artery and Posterior Tibial artery pedal pulses are 2/4 bilateral with immedate capillary fill time. Pedal hair growth present. No varicosities and no lower extremity edema present bilateral.   Neruologic: Grossly intact via light touch bilateral. Vibratory intact via tuning fork bilateral. Protective threshold with Semmes Wienstein monofilament intact to all pedal sites bilateral. Patellar and Achilles deep tendon reflexes 2+ bilateral. No Babinski or clonus noted bilateral.   Musculoskeletal: No gross boney pedal deformities bilateral. No pain, crepitus, or limitation noted with foot and ankle range of motion bilateral. Muscular strength 5/5 in all groups tested bilateral.  Pain on range of motion of the subtalar joint of the right foot.  With pain on end range of motion and pain with sinus tarsi palpation.  Gait: Unassisted, Nonantalgic.    Radiographs:  None taken  Assessment & Plan:   Assessment: Subtalar joint capsulitis right.  Plan: Injected the area today 20 mg of Kenalog 5 mg Marcaine and started her on methylprednisolone we will follow-up with her on an as-needed basis.     Alysen Smylie T. [place], [place]

## 2023-04-17 IMAGING — MG MM DIGITAL SCREENING BILAT W/ TOMO AND CAD
6 of 12 series · 6 of 36 positions shown · non-contrast
Comparison: Previous exam(s).

CLINICAL DATA: Screening.

EXAM:
DIGITAL SCREENING BILATERAL MAMMOGRAM WITH TOMOSYNTHESIS AND CAD
TECHNIQUE: Bilateral screening digital craniocaudal and mediolateral oblique
mammograms were obtained. Bilateral screening digital breast
tomosynthesis was performed. The images were evaluated with
computer-aided detection.

[R MLO synth-2D (1 of 2)]
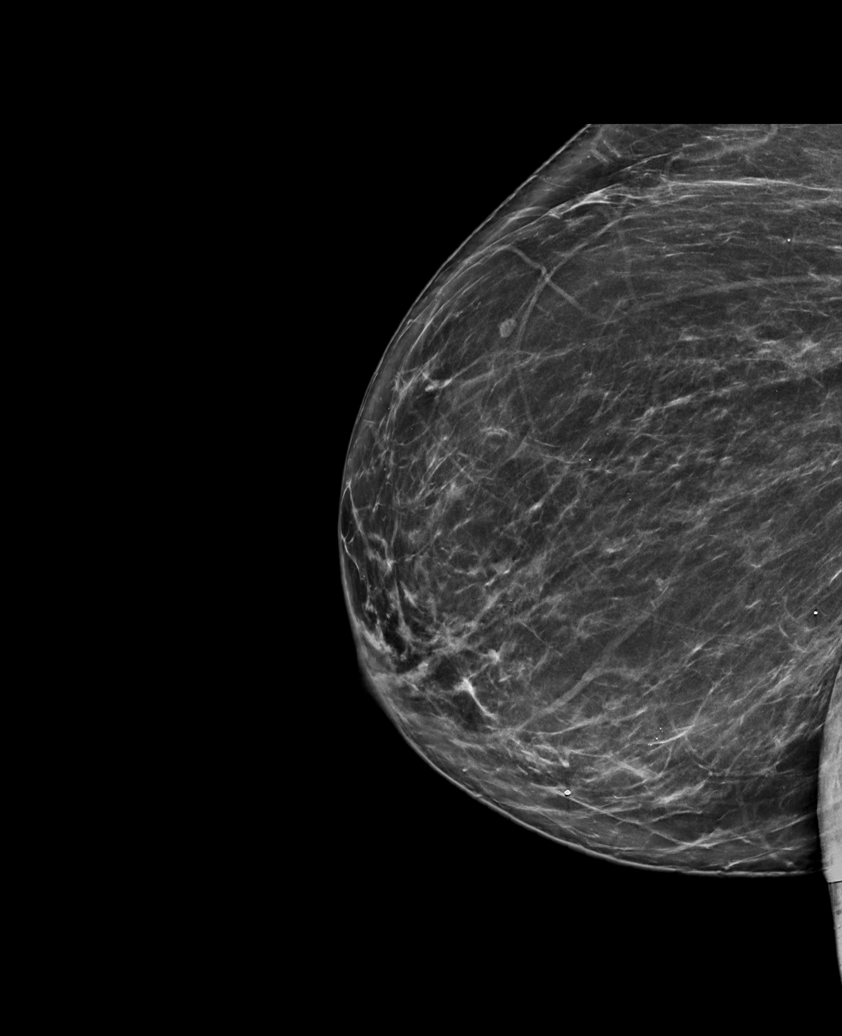

[L MLO synth-2D]
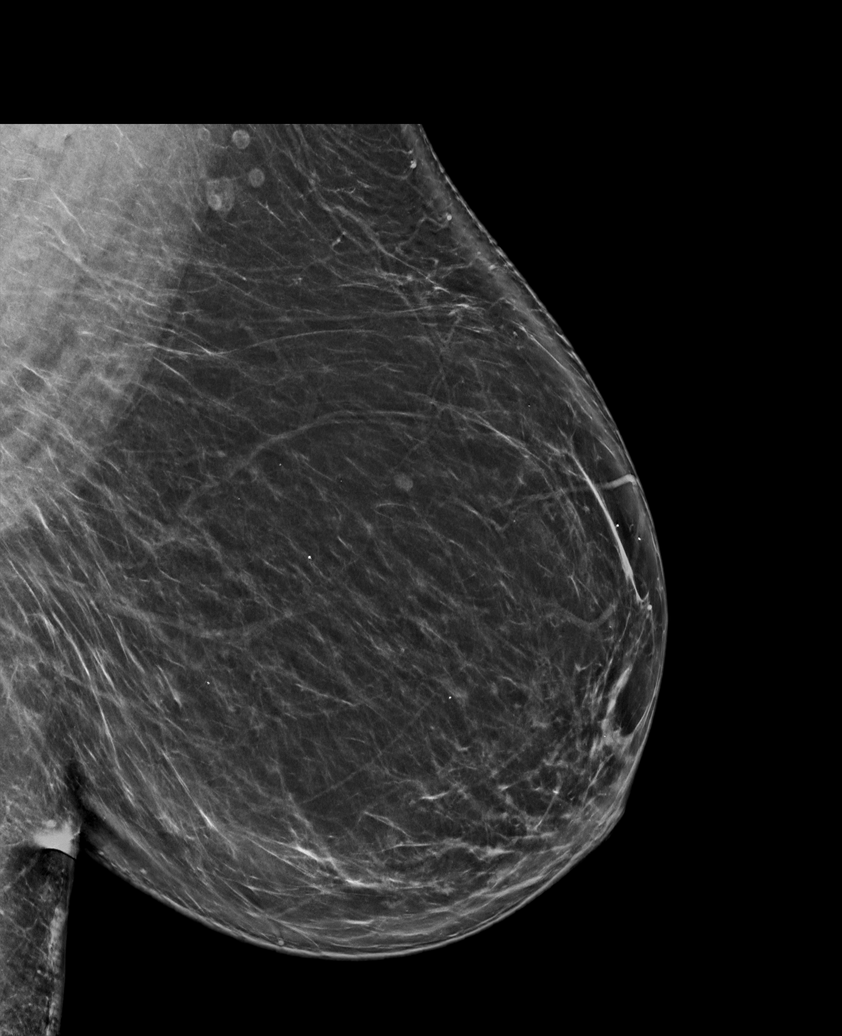

[R MLO synth-2D (2 of 2)]
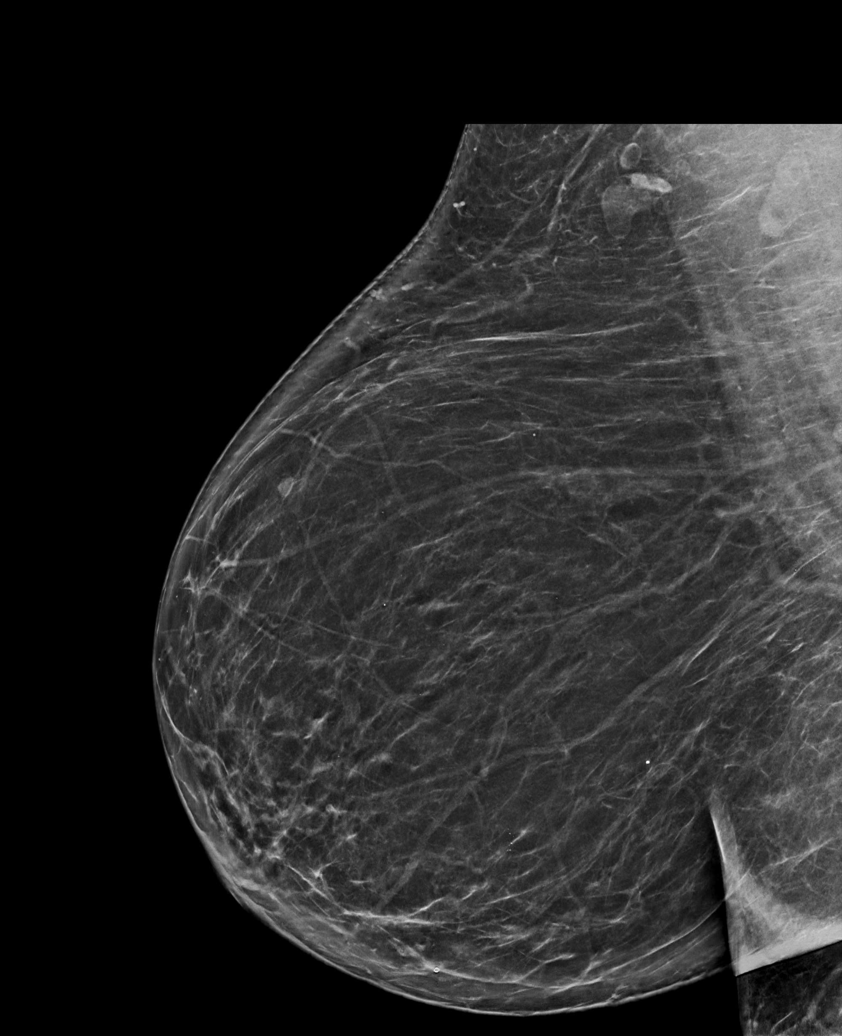

[R CC synth-2D]
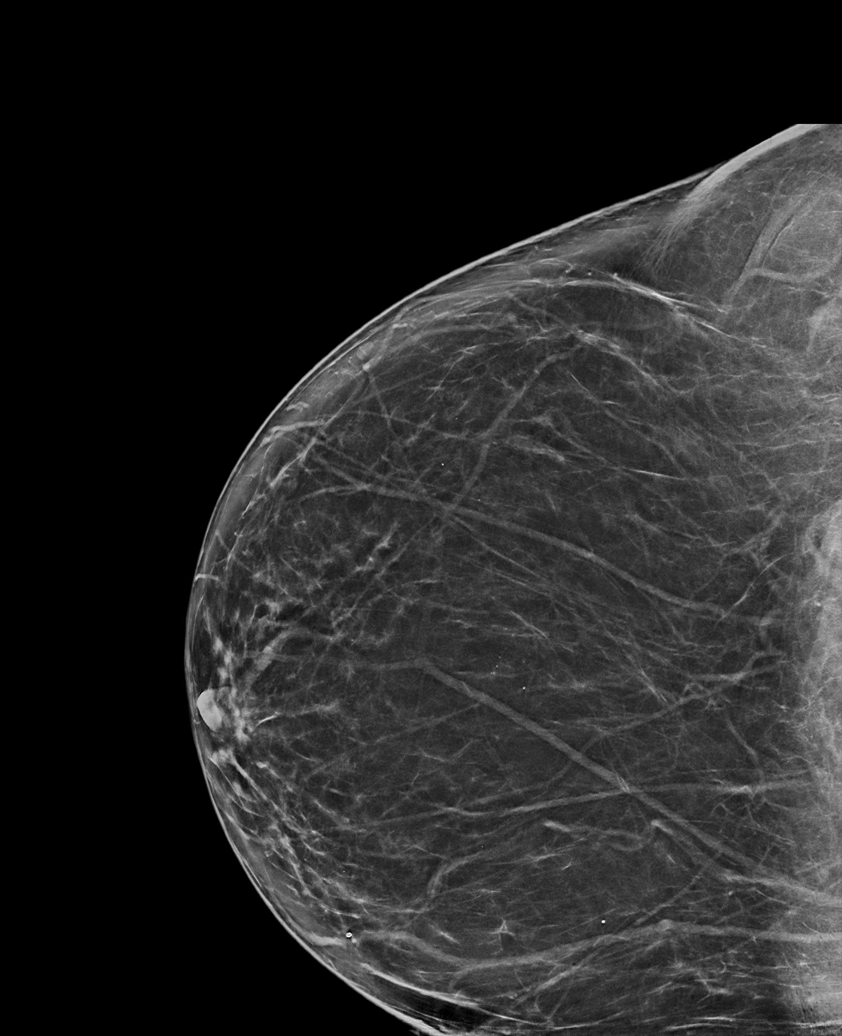

[R CV synth-2D]
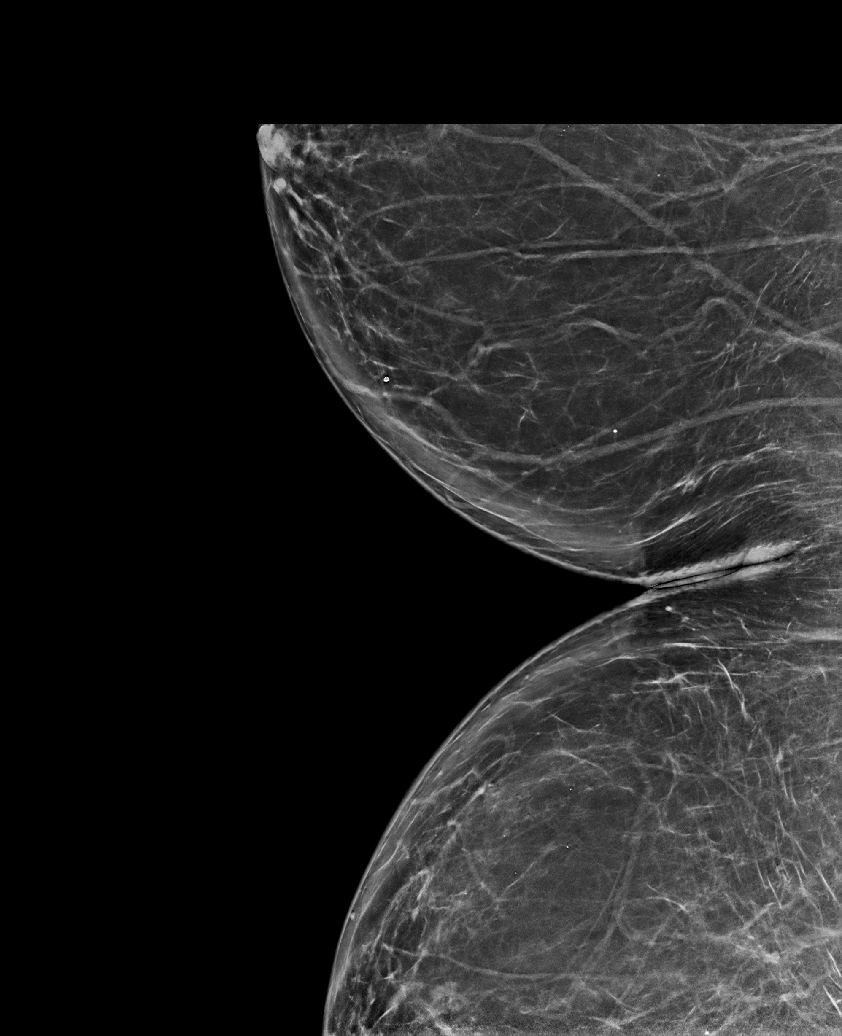

[L CC synth-2D]
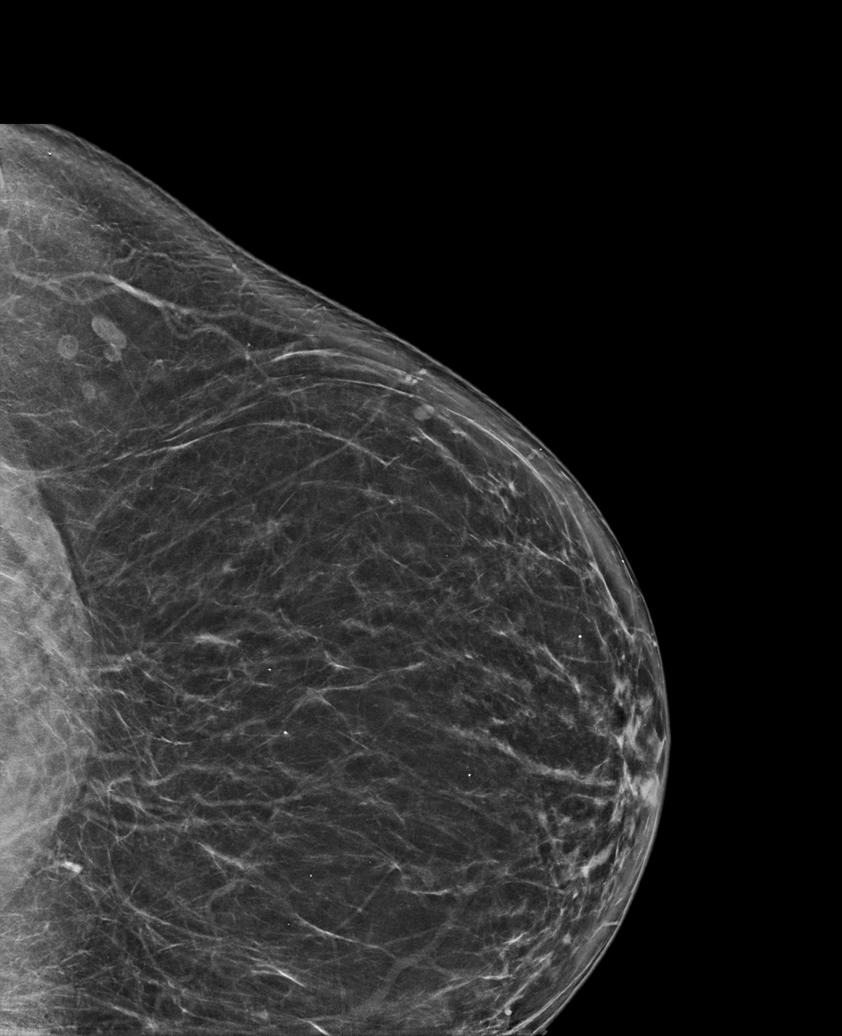

[6 of 36 positions shown; findings below may reference images not displayed]

ACR Breast Density Category b: There are scattered areas of
fibroglandular density.
FINDINGS: There are no findings suspicious for malignancy.
IMPRESSION: No mammographic evidence of malignancy. A result letter of this
screening mammogram will be mailed directly to the patient.

RECOMMENDATION:
Screening mammogram in one year. (Code:51-O-LD2)

BI-RADS CATEGORY  1: Negative.

## 2023-04-19 DIAGNOSIS — I10 Essential (primary) hypertension: Secondary | ICD-10-CM | POA: Diagnosis not present

## 2023-04-19 DIAGNOSIS — Z6841 Body Mass Index (BMI) 40.0 and over, adult: Secondary | ICD-10-CM | POA: Diagnosis not present

## 2023-04-19 DIAGNOSIS — Z79899 Other long term (current) drug therapy: Secondary | ICD-10-CM | POA: Diagnosis not present

## 2023-04-19 DIAGNOSIS — M109 Gout, unspecified: Secondary | ICD-10-CM | POA: Diagnosis not present

## 2023-07-26 ENCOUNTER — Other Ambulatory Visit: Payer: Self-pay | Admitting: Family Medicine

## 2023-07-26 DIAGNOSIS — Z1231 Encounter for screening mammogram for malignant neoplasm of breast: Secondary | ICD-10-CM

## 2023-09-15 DIAGNOSIS — I10 Essential (primary) hypertension: Secondary | ICD-10-CM | POA: Diagnosis not present

## 2023-09-15 DIAGNOSIS — R739 Hyperglycemia, unspecified: Secondary | ICD-10-CM | POA: Diagnosis not present

## 2023-09-15 DIAGNOSIS — L900 Lichen sclerosus et atrophicus: Secondary | ICD-10-CM | POA: Diagnosis not present

## 2023-09-15 DIAGNOSIS — M109 Gout, unspecified: Secondary | ICD-10-CM | POA: Diagnosis not present

## 2023-09-15 DIAGNOSIS — Z6841 Body Mass Index (BMI) 40.0 and over, adult: Secondary | ICD-10-CM | POA: Diagnosis not present

## 2023-09-15 DIAGNOSIS — Z13220 Encounter for screening for lipoid disorders: Secondary | ICD-10-CM | POA: Diagnosis not present

## 2023-09-15 DIAGNOSIS — L309 Dermatitis, unspecified: Secondary | ICD-10-CM | POA: Diagnosis not present

## 2023-09-15 DIAGNOSIS — Z0000 Encounter for general adult medical examination without abnormal findings: Secondary | ICD-10-CM | POA: Diagnosis not present

## 2023-10-12 ENCOUNTER — Ambulatory Visit
Admission: RE | Admit: 2023-10-12 | Discharge: 2023-10-12 | Disposition: A | Discharge status: Home / Self Care | Payer: Medicare HMO | Source: Ambulatory Visit | Attending: Family Medicine | Admitting: Family Medicine

## 2023-10-12 DIAGNOSIS — Z1231 Encounter for screening mammogram for malignant neoplasm of breast: Secondary | ICD-10-CM | POA: Diagnosis not present

## 2023-10-13 DIAGNOSIS — Z0100 Encounter for examination of eyes and vision without abnormal findings: Secondary | ICD-10-CM | POA: Diagnosis not present

## 2023-10-13 DIAGNOSIS — H43813 Vitreous degeneration, bilateral: Secondary | ICD-10-CM | POA: Diagnosis not present

## 2024-02-01 DIAGNOSIS — R058 Other specified cough: Secondary | ICD-10-CM | POA: Diagnosis not present

## 2024-03-15 DIAGNOSIS — I10 Essential (primary) hypertension: Secondary | ICD-10-CM | POA: Diagnosis not present

## 2024-06-22 ENCOUNTER — Other Ambulatory Visit: Payer: Self-pay | Admitting: Family Medicine

## 2024-06-22 DIAGNOSIS — I10 Essential (primary) hypertension: Secondary | ICD-10-CM | POA: Diagnosis not present

## 2024-06-22 DIAGNOSIS — N6452 Nipple discharge: Secondary | ICD-10-CM | POA: Diagnosis not present

## 2024-06-22 DIAGNOSIS — R7301 Impaired fasting glucose: Secondary | ICD-10-CM | POA: Diagnosis not present

## 2024-06-27 ENCOUNTER — Ambulatory Visit
Admission: RE | Admit: 2024-06-27 | Discharge: 2024-06-27 | Disposition: A | Discharge status: Home / Self Care | Source: Ambulatory Visit | Attending: Family Medicine | Admitting: Family Medicine

## 2024-06-27 ENCOUNTER — Inpatient Hospital Stay
Admission: RE | Admit: 2024-06-27 | Discharge: 2024-06-27 | Discharge status: Home / Self Care | Source: Ambulatory Visit | Attending: Family Medicine

## 2024-06-27 DIAGNOSIS — N6452 Nipple discharge: Secondary | ICD-10-CM | POA: Diagnosis not present

## 2024-07-17 DIAGNOSIS — N6452 Nipple discharge: Secondary | ICD-10-CM | POA: Diagnosis not present

## 2024-07-17 DIAGNOSIS — Z90710 Acquired absence of both cervix and uterus: Secondary | ICD-10-CM | POA: Diagnosis not present

## 2024-07-17 DIAGNOSIS — Z01419 Encounter for gynecological examination (general) (routine) without abnormal findings: Secondary | ICD-10-CM | POA: Diagnosis not present

## 2024-08-28 ENCOUNTER — Other Ambulatory Visit: Payer: Self-pay | Admitting: Family Medicine

## 2024-08-28 DIAGNOSIS — Z1231 Encounter for screening mammogram for malignant neoplasm of breast: Secondary | ICD-10-CM

## 2024-10-15 DIAGNOSIS — Z0000 Encounter for general adult medical examination without abnormal findings: Secondary | ICD-10-CM | POA: Diagnosis not present

## 2024-10-15 DIAGNOSIS — I10 Essential (primary) hypertension: Secondary | ICD-10-CM | POA: Diagnosis not present

## 2024-10-15 DIAGNOSIS — Z136 Encounter for screening for cardiovascular disorders: Secondary | ICD-10-CM | POA: Diagnosis not present

## 2024-10-15 DIAGNOSIS — Z13220 Encounter for screening for lipoid disorders: Secondary | ICD-10-CM | POA: Diagnosis not present

## 2024-10-15 DIAGNOSIS — E118 Type 2 diabetes mellitus with unspecified complications: Secondary | ICD-10-CM | POA: Diagnosis not present

## 2024-10-17 DIAGNOSIS — Z0000 Encounter for general adult medical examination without abnormal findings: Secondary | ICD-10-CM | POA: Diagnosis not present

## 2024-10-17 DIAGNOSIS — I10 Essential (primary) hypertension: Secondary | ICD-10-CM | POA: Diagnosis not present

## 2024-10-17 DIAGNOSIS — E782 Mixed hyperlipidemia: Secondary | ICD-10-CM | POA: Diagnosis not present

## 2024-10-17 DIAGNOSIS — N6452 Nipple discharge: Secondary | ICD-10-CM | POA: Diagnosis not present

## 2024-10-17 DIAGNOSIS — E118 Type 2 diabetes mellitus with unspecified complications: Secondary | ICD-10-CM | POA: Diagnosis not present

## 2024-11-05 DIAGNOSIS — H43813 Vitreous degeneration, bilateral: Secondary | ICD-10-CM | POA: Diagnosis not present

## 2024-11-06 DIAGNOSIS — Z0100 Encounter for examination of eyes and vision without abnormal findings: Secondary | ICD-10-CM | POA: Diagnosis not present
